# Patient Record
Sex: Female | Born: 1970 | Hispanic: Yes | Marital: Single | Smoking: Never smoker
Health system: Southern US, Community
[De-identification: ages and names within clinical notes are randomized; demographics above are authoritative.]

## PROBLEM LIST (undated history)

## (undated) DIAGNOSIS — E119 Type 2 diabetes mellitus without complications: Secondary | ICD-10-CM

## (undated) DIAGNOSIS — I10 Essential (primary) hypertension: Secondary | ICD-10-CM

## (undated) HISTORY — DX: Type 2 diabetes mellitus without complications: E11.9

## (undated) HISTORY — DX: Essential (primary) hypertension: I10

---

## 2012-01-02 ENCOUNTER — Ambulatory Visit: Payer: Self-pay | Admitting: Primary Care

## 2013-02-15 ENCOUNTER — Ambulatory Visit: Payer: Self-pay

## 2013-03-02 ENCOUNTER — Ambulatory Visit: Payer: Self-pay

## 2013-08-31 ENCOUNTER — Ambulatory Visit: Payer: Self-pay

## 2014-05-18 ENCOUNTER — Ambulatory Visit: Payer: Self-pay

## 2022-08-08 ENCOUNTER — Encounter: Payer: Self-pay | Admitting: Primary Care

## 2022-09-10 ENCOUNTER — Other Ambulatory Visit: Payer: Self-pay

## 2022-09-10 DIAGNOSIS — Z1231 Encounter for screening mammogram for malignant neoplasm of breast: Secondary | ICD-10-CM

## 2022-09-17 ENCOUNTER — Ambulatory Visit: Payer: Self-pay

## 2022-09-17 ENCOUNTER — Ambulatory Visit: Admission: RE | Admit: 2022-09-17 | Payer: Self-pay | Source: Ambulatory Visit

## 2022-10-21 ENCOUNTER — Ambulatory Visit: Payer: Self-pay

## 2022-12-09 ENCOUNTER — Ambulatory Visit: Payer: Self-pay

## 2022-12-09 ENCOUNTER — Telehealth: Payer: Self-pay

## 2022-12-09 NOTE — Telephone Encounter (Signed)
I have attempted to call the pt using [address], [place], [place]: [id number], regarding her missed appt today at 2:30pm. Pt did not answer but we did leave a voice message advising of the number to call to reschedule her appt.

## 2023-02-24 ENCOUNTER — Ambulatory Visit: Payer: Self-pay | Attending: Hematology and Oncology | Admitting: *Deleted

## 2023-02-24 ENCOUNTER — Ambulatory Visit
Admission: RE | Admit: 2023-02-24 | Discharge: 2023-02-24 | Disposition: A | Discharge status: Home / Self Care | Payer: Self-pay | Source: Ambulatory Visit | Attending: Obstetrics and Gynecology | Admitting: Obstetrics and Gynecology

## 2023-02-24 VITALS — BP 124/87 | Wt 147.8 lb

## 2023-02-24 DIAGNOSIS — Z1231 Encounter for screening mammogram for malignant neoplasm of breast: Secondary | ICD-10-CM | POA: Insufficient documentation

## 2023-02-24 DIAGNOSIS — Z1239 Encounter for other screening for malignant neoplasm of breast: Secondary | ICD-10-CM

## 2023-02-24 NOTE — Patient Instructions (Signed)
Explained breast self awareness with [name]. Patient did not need a Pap smear today due to last Pap smear was 03/22/2020. Let her know [facility] will cover Pap smears every 3 years unless has a history of abnormal Pap smears. Referred patient to the [facility] for a screening mammogram. Appointment scheduled Monday, February 24, 2023 at 1540. Patient aware of appointment and will be there. Let patient know Delford Field will follow up with her within the next couple weeks with results of her mammogram by letter or phone. Alicia Herman verbalized understanding.  [name], [name], RN 3:22 PM

## 2023-02-24 NOTE — Progress Notes (Signed)
Ms. Zyianna Macrina is a 52 y.o. female who presents to [facility] clinic today with no complaints.    Pap Smear: Pap smear not completed today. Last Pap smear was 03/22/2020 at [facility] clinic and was normal. Per patient has no history of an abnormal Pap smear. Last Pap smear result is available in Epic.   Physical exam: Breasts Breasts symmetrical. No skin abnormalities bilateral breasts. No nipple retraction bilateral breasts. No nipple discharge left breast. Noticed a white dried up discharge within the right nipple. Patient stated that has occurred on and off since prior to her previous mammogram in 2015 that was benign. No lymphadenopathy. No lumps palpated bilateral breasts. No complaints of pain or tenderness on exam.      Pelvic/Bimanual Pap is not indicated today per [facility] guidelines. Message since to [facility] to schedule Pap smear with [facility] after 03/23/2023 when Pap smear is due.   Smoking History: Patient has never smoked.   Patient Navigation: Patient education provided. Access to services provided for patient through [facility] program. Spanish interpreter Rito Ehrlich from [facility] provided.   Colorectal Cancer Screening: Per patient has never had colonoscopy completed. Patient was given a FIT Test by her PCP in August 2004. No complaints today.    Breast and Cervical Cancer Risk Assessment: Patient does not have family history of breast cancer, known genetic mutations, or radiation treatment to the chest before age 44. Patient does not have history of cervical dysplasia, immunocompromised, or DES exposure in-utero.  Risk Scores as of Encounter on 02/24/2023     Dondra Spry           5-year 0.9%   Lifetime 7.92%   This patient is [place]ina but has no documented birth country, so the [place] model used data from [place] patients to calculate their risk score. Document a birth country in the Demographics activity for a more accurate score.         Last  calculated by Narda Rutherford, LPN on 7/82/9562 at  3:17 PM        A: [facility] exam without pap smear No complaints.  P: Referred patient to the [facility] for a screening mammogram. Appointment scheduled Monday, February 24, 2023 at 1540.  Priscille Heidelberg, RN 02/24/2023 3:22 PM

## 2023-03-31 ENCOUNTER — Ambulatory Visit: Payer: Self-pay | Attending: Pediatrics
